# Patient Record
Sex: Male | Born: 1995 | Race: Black or African American | Hispanic: No | Marital: Married | Smoking: Current every day smoker
Health system: Southern US, Community
[De-identification: ages and names within clinical notes are randomized; demographics above are authoritative.]

## PROBLEM LIST (undated history)

## (undated) DIAGNOSIS — J45909 Unspecified asthma, uncomplicated: Secondary | ICD-10-CM

## (undated) DIAGNOSIS — E119 Type 2 diabetes mellitus without complications: Secondary | ICD-10-CM

---

## 2014-11-20 ENCOUNTER — Emergency Department (HOSPITAL_COMMUNITY): Payer: Medicaid Other

## 2014-11-20 ENCOUNTER — Encounter (HOSPITAL_COMMUNITY): Payer: Self-pay | Admitting: Emergency Medicine

## 2014-11-20 ENCOUNTER — Emergency Department (HOSPITAL_COMMUNITY)
Admission: EM | Admit: 2014-11-20 | Discharge: 2014-11-20 | Disposition: A | Discharge status: Home / Self Care | Payer: Medicaid Other | Attending: Emergency Medicine | Admitting: Emergency Medicine

## 2014-11-20 DIAGNOSIS — J209 Acute bronchitis, unspecified: Secondary | ICD-10-CM | POA: Diagnosis not present

## 2014-11-20 DIAGNOSIS — Z720 Tobacco use: Secondary | ICD-10-CM | POA: Diagnosis not present

## 2014-11-20 DIAGNOSIS — J40 Bronchitis, not specified as acute or chronic: Secondary | ICD-10-CM

## 2014-11-20 DIAGNOSIS — R05 Cough: Secondary | ICD-10-CM | POA: Diagnosis present

## 2014-11-20 MED ORDER — AZITHROMYCIN 250 MG PO TABS: 250.0000 mg | ORAL_TABLET | Freq: Every day | ORAL | Status: DC

## 2014-11-20 MED ORDER — ALBUTEROL SULFATE HFA 108 (90 BASE) MCG/ACT IN AERS: 1.0000 | INHALATION_SPRAY | Freq: Four times a day (QID) | RESPIRATORY_TRACT | Status: DC | PRN

## 2014-11-20 MED ORDER — BENZONATATE 100 MG PO CAPS: 100.0000 mg | ORAL_CAPSULE | Freq: Three times a day (TID) | ORAL | Status: DC

## 2014-11-20 NOTE — Discharge Instructions (Signed)

## 2014-11-20 NOTE — ED Provider Notes (Signed)
  History  By signing my name below, I, Karle PlumberJennifer Tensley, attest that this documentation has been prepared under the direction and in the presence of Langston MaskerKaren Tiandra Swoveland, [place]-C. Electronically Signed: Karle PlumberJennifer Tensley, ED Scribe. 11/20/2014. 5:28 PM.  Chief Complaint  Patient presents with  . Cough   The history is provided by the patient and medical records. No language interpreter was used.    HPI Comments:  Cody Tran is a 19 y.o. male who presents to the Emergency Department complaining of productive cough of yellow sputum that began about one week ago. He reports intermittent sore throat. He denies any known sick contacts. He has not taken anything to treat his symptoms. He denies modifying factors. He denies fever, chills, nausea or vomiting. He did not have a flu vaccination this year. He denies any chronic lung illnesses. He denies smoking but reports second hand exposure.  History reviewed. No pertinent past medical history. History reviewed. No pertinent past surgical history. No family history on file. Social History  Substance Use Topics  . Smoking status: Current Every Day Smoker  . Smokeless tobacco: None  . Alcohol Use: No    Review of Systems  Respiratory: Positive for cough.   All other systems reviewed and are negative.   Allergies  Review of patient's allergies indicates no known allergies.  Home Medications   Prior to Admission medications   Not on File   Triage Vitals: BP 131/62 mmHg  Pulse 98  Temp(Src) 97.9 F (36.6 C) (Oral)  Resp 20  Ht 5\' 9"  (1.753 m)  Wt 140 lb (63.504 kg)  BMI 20.67 kg/m2  SpO2 100% Physical Exam  Constitutional: He is oriented to person, place, and time. He appears well-developed and well-nourished.  HENT:  Head: Normocephalic and atraumatic.  Eyes: EOM are normal.  Neck: Normal range of motion.  Cardiovascular: Normal rate.   Pulmonary/Chest: Effort normal.  Musculoskeletal: Normal range of motion.  Neurological: He is  alert and oriented to person, place, and time.  Skin: Skin is warm and dry.  Psychiatric: He has a normal mood and affect. His behavior is normal.  Nursing note and vitals reviewed.   ED Course  Procedures (including critical care time) DIAGNOSTIC STUDIES: Oxygen Saturation is 100% on RA, normal by my interpretation.   COORDINATION OF CARE: 4:59 PM- Will order CXR. Pt verbalizes understanding and agrees to plan.  Medications - No data to display  Labs Review Labs Reviewed - No data to display  Imaging Review Dg Chest 2 View  11/20/2014  CLINICAL DATA:  Productive cough.  Yellow sputum.  Smoker. EXAM: CHEST - 2 VIEW COMPARISON:  None. FINDINGS: The heart size and mediastinal contours are within normal limits. Both lungs are clear. The visualized skeletal structures are unremarkable. IMPRESSION: Negative two view chest x-ray Electronically Signed   By: Marin Robertshristopher  Mattern M.D.   On: 11/20/2014 17:24   I have personally reviewed and evaluated these images and lab results as part of my medical decision-making.   EKG Interpretation None      MDM   Final diagnoses:  Bronchitis     zithromax Tessalon Return if any problems.  Lonia SkinnerLeslie K La PineSofia, PA-C 11/20/14 1746  Donnetta HutchingBrian Cook, MD 11/20/14 [id number]

## 2014-11-20 NOTE — ED Notes (Signed)
Pt c/o productive cough with yellow sputum x 1 week.

## 2016-02-03 ENCOUNTER — Emergency Department (HOSPITAL_COMMUNITY)
Admission: EM | Admit: 2016-02-03 | Discharge: 2016-02-03 | Disposition: A | Discharge status: Home / Self Care | Payer: Medicaid Other | Attending: Emergency Medicine | Admitting: Emergency Medicine

## 2016-02-03 ENCOUNTER — Encounter (HOSPITAL_COMMUNITY): Payer: Self-pay | Admitting: Emergency Medicine

## 2016-02-03 DIAGNOSIS — F17200 Nicotine dependence, unspecified, uncomplicated: Secondary | ICD-10-CM | POA: Insufficient documentation

## 2016-02-03 DIAGNOSIS — B349 Viral infection, unspecified: Secondary | ICD-10-CM | POA: Insufficient documentation

## 2016-02-03 MED ORDER — IBUPROFEN 400 MG PO TABS
400.0000 mg | ORAL_TABLET | Freq: Once | ORAL | Status: AC
  Administered 2016-02-03: 400 mg via ORAL
  Filled 2016-02-03: qty 1

## 2016-02-03 MED ORDER — ACETAMINOPHEN 325 MG PO TABS
650.0000 mg | ORAL_TABLET | Freq: Once | ORAL | Status: AC
  Administered 2016-02-03: 650 mg via ORAL
  Filled 2016-02-03: qty 2

## 2016-02-03 MED ORDER — OSELTAMIVIR PHOSPHATE 75 MG PO CAPS: 75.0000 mg | ORAL_CAPSULE | Freq: Two times a day (BID) | ORAL | 0 refills | Status: DC

## 2016-02-03 NOTE — ED Provider Notes (Signed)
[facility]-EMERGENCY DEPT Provider Note   CSN: [id number] Arrival date & time: 02/03/16  1538     History   Chief Complaint Chief Complaint  Patient presents with  . Fever  . Generalized Body Aches    HPI Cody Tran is a 21 y.o. male.  Feeling hot and cold, body aches, cough for 24 hours. Patient is taken Benadryl and Alka-Seltzer with minimal relief. He is normally healthy. He has been drinking fluids. Severity of symptoms is mild to moderate. No stiff neck.      History reviewed. No pertinent past medical history.  There are no active problems to display for this patient.   History reviewed. No pertinent surgical history.     Home Medications    Prior to Admission medications   Medication Sig Start Date End Date Taking? Authorizing Provider  ciprofloxacin (CIPRO) 500 MG tablet Take 500 mg by mouth 2 (two) times daily. 7 day course completed. Started on 01/19/2016 01/19/16   Historical Provider, MD  oseltamivir (TAMIFLU) 75 MG capsule Take 1 capsule (75 mg total) by mouth every 12 (twelve) hours. 02/03/16   Donnetta Hutching, MD    Family History No family history on file.  Social History Social History  Substance Use Topics  . Smoking status: Current Every Day Smoker  . Smokeless tobacco: Never Used  . Alcohol use No     Allergies   Patient has no known allergies.   Review of Systems Review of Systems  All other systems reviewed and are negative.    Physical Exam Updated Vital Signs BP 126/67 (BP Location: Left Arm)   Pulse 105   Temp 102.4 F (39.1 C) (Oral)   Resp 18   Ht 6\' 1"  (1.854 m)   Wt 232 lb (105.2 kg)   SpO2 100%   BMI 30.61 kg/m   Physical Exam  Constitutional: He is oriented to person, place, and time. He appears well-developed and well-nourished.  No acute distress  HENT:  Head: Normocephalic and atraumatic.  Clear rhinorrhea  Eyes: Conjunctivae are normal.  Neck: Neck supple.  Cardiovascular: Normal rate and regular rhythm.     Pulmonary/Chest: Effort normal and breath sounds normal.  Abdominal: Soft. Bowel sounds are normal.  Musculoskeletal: Normal range of motion.  Neurological: He is alert and oriented to person, place, and time.  Skin: Skin is warm and dry.  Psychiatric: He has a normal mood and affect. His behavior is normal.  Nursing note and vitals reviewed.    ED Treatments / Results  Labs (all labs ordered are listed, but only abnormal results are displayed) Labs Reviewed - No data to display  EKG  EKG Interpretation None       Radiology No results found.  Procedures Procedures (including critical care time)  Medications Ordered in ED Medications  acetaminophen (TYLENOL) tablet 650 mg (650 mg Oral Given 02/03/16 1610)  ibuprofen (ADVIL,MOTRIN) tablet 400 mg (400 mg Oral Given 02/03/16 1717)     Initial Impression / Assessment and Plan / ED Course  I have reviewed the triage vital signs and the nursing notes.  Pertinent labs & imaging results that were available during my care of the patient were reviewed by me and considered in my medical decision making (see chart for details).     History and physical most compatible with viral syndrome. Will Rx Tamiflu at patient's request. Otherwise, I recommend fluids, Tylenol/ibuprofen, over-the-counter cold and flu meds  Final Clinical Impressions(s) / ED Diagnoses   Final diagnoses:  Viral syndrome    New Prescriptions New Prescriptions   OSELTAMIVIR (TAMIFLU) 75 MG CAPSULE    Take 1 capsule (75 mg total) by mouth every 12 (twelve) hours.     Donnetta HutchingBrian Dary Dilauro, MD 02/03/16 1740

## 2016-02-03 NOTE — Discharge Instructions (Signed)
Increase fluids, Tylenol or ibuprofen, recommend over-the-counter cold and flu medication. Rx for Tamiflu

## 2016-02-03 NOTE — ED Triage Notes (Signed)
Pt reports fever and body aches, productive cough since yesterday. States he took benadryl and alka seltzer plus this morning around 9 am.

## 2016-10-07 IMAGING — DX DG CHEST 2V
2 series · 2 of 2 positions shown · non-contrast
Comparison: None.

CLINICAL DATA: Productive cough.  Yellow sputum.  Smoker.

EXAM:
CHEST - 2 VIEW

[chest pa]
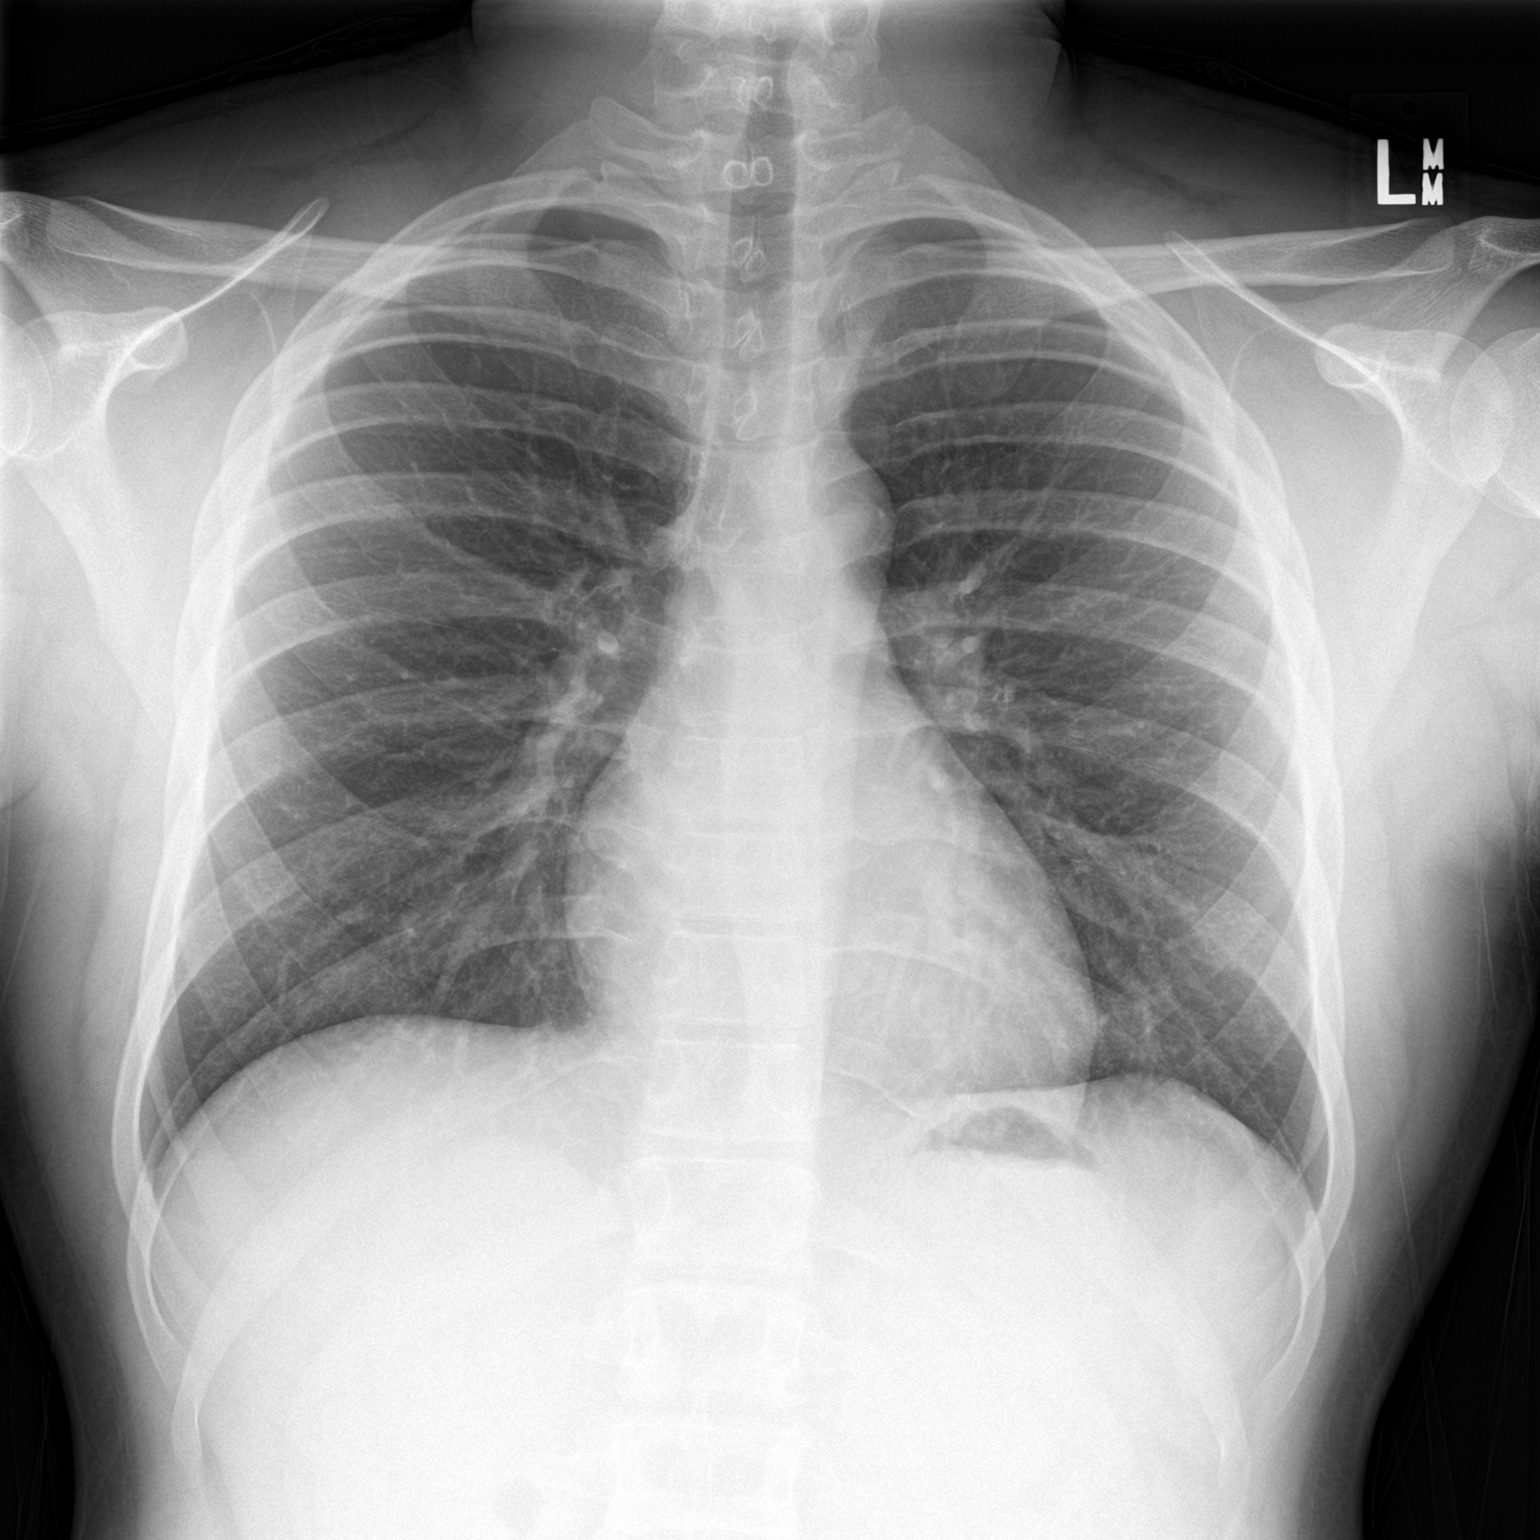

[chest lat]
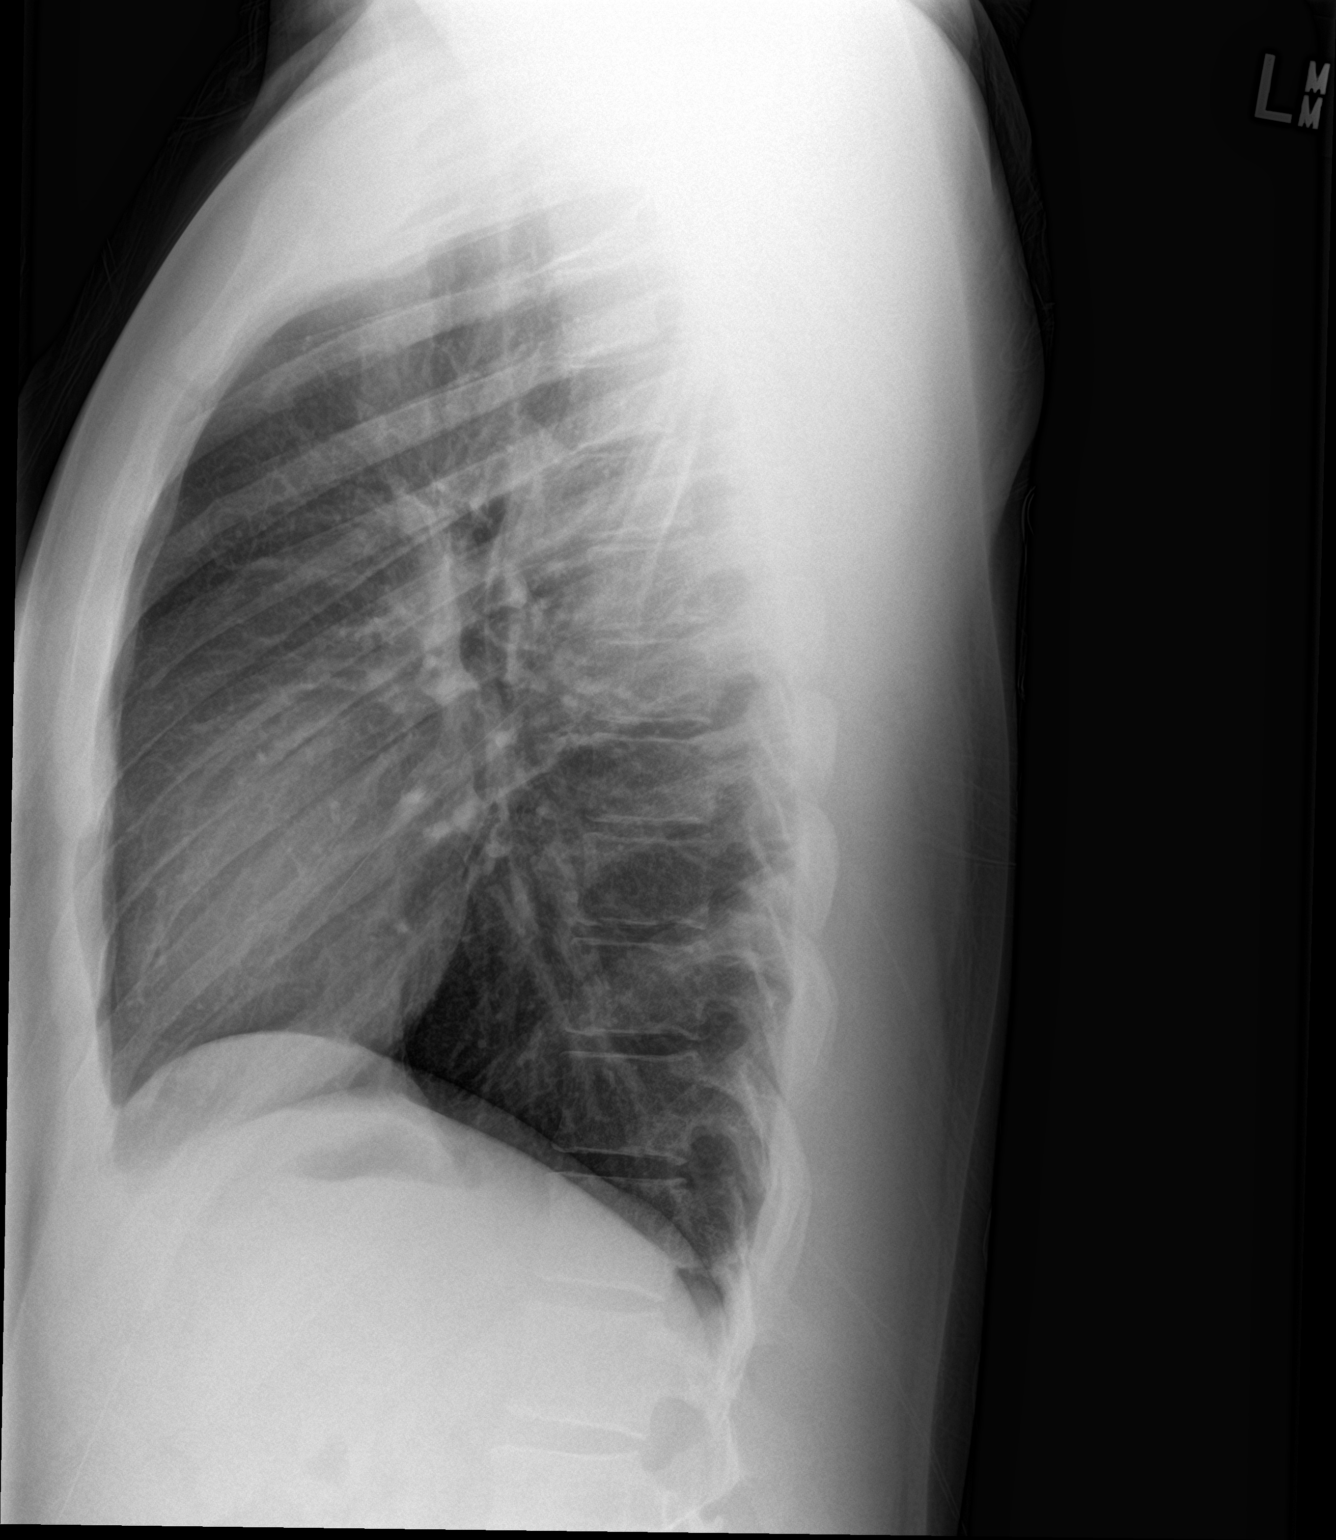

[2 of 2 positions shown; findings below may reference images not displayed]

FINDINGS: The heart size and mediastinal contours are within normal limits.
Both lungs are clear. The visualized skeletal structures are
unremarkable.
IMPRESSION: Negative two view chest x-ray

## 2018-03-04 ENCOUNTER — Encounter (HOSPITAL_COMMUNITY): Payer: Self-pay | Admitting: Emergency Medicine

## 2018-03-04 ENCOUNTER — Emergency Department (HOSPITAL_COMMUNITY)
Admission: EM | Admit: 2018-03-04 | Discharge: 2018-03-04 | Disposition: A | Discharge status: Home / Self Care | Payer: BLUE CROSS/BLUE SHIELD | Attending: Emergency Medicine | Admitting: Emergency Medicine

## 2018-03-04 ENCOUNTER — Other Ambulatory Visit: Payer: Self-pay

## 2018-03-04 DIAGNOSIS — J45909 Unspecified asthma, uncomplicated: Secondary | ICD-10-CM | POA: Diagnosis not present

## 2018-03-04 DIAGNOSIS — F17200 Nicotine dependence, unspecified, uncomplicated: Secondary | ICD-10-CM | POA: Insufficient documentation

## 2018-03-04 DIAGNOSIS — R05 Cough: Secondary | ICD-10-CM | POA: Diagnosis present

## 2018-03-04 DIAGNOSIS — J209 Acute bronchitis, unspecified: Secondary | ICD-10-CM | POA: Insufficient documentation

## 2018-03-04 DIAGNOSIS — J40 Bronchitis, not specified as acute or chronic: Secondary | ICD-10-CM

## 2018-03-04 HISTORY — DX: Unspecified asthma, uncomplicated: J45.909

## 2018-03-04 MED ORDER — PREDNISONE 20 MG PO TABS
40.0000 mg | ORAL_TABLET | Freq: Once | ORAL | Status: AC
Start: 2018-03-04 — End: 2018-03-04
  Administered 2018-03-04: 40 mg via ORAL
  Filled 2018-03-04: qty 2

## 2018-03-04 MED ORDER — DEXAMETHASONE 4 MG PO TABS: 4.0000 mg | ORAL_TABLET | Freq: Two times a day (BID) | ORAL | 0 refills | Status: DC

## 2018-03-04 MED ORDER — ALBUTEROL SULFATE HFA 108 (90 BASE) MCG/ACT IN AERS
2.0000 | INHALATION_SPRAY | Freq: Once | RESPIRATORY_TRACT | Status: AC
  Administered 2018-03-04: 2 via RESPIRATORY_TRACT
  Filled 2018-03-04: qty 6.7

## 2018-03-04 NOTE — Progress Notes (Signed)
**Note De-Identified Uriyah Raska Obfuscation** Patient flow measured with dial a check system and education on MDI with spacer

## 2018-03-04 NOTE — Discharge Instructions (Signed)
Your blood pressure is elevated today.  Please have this rechecked soon.  Your oxygen level is 99% on room air.  Within normal limits by my interpretation.  Please increase fluids.  Please use 2 puffs of albuterol every 4 hours as needed for wheezing or difficulty with breathing.  Use Decadron 2 times daily with food.  Please avoid being around flour or similar substances.  Please see the physicians at the [facility] clinic to establish a primary care physician.

## 2018-03-04 NOTE — ED Triage Notes (Signed)
Cough and wheezing since working around flour at work today.  resp even and non labored.  Pt reports he needs a dr note to return to work.

## 2018-03-04 NOTE — ED Provider Notes (Signed)
[facility] EMERGENCY DEPARTMENT Provider Note   CSN: [id number] Arrival date & time: 03/04/18  1514    History   Chief Complaint Chief Complaint  Patient presents with  . Cough    HPI Cody Tran is a 23 y.o. male.     Patient is a 23 year old male who presents to the emergency department with a complaint of coughing and wheezing.  The patient states that today he was placed in an area of his work where he had to be around a lot of flour and dust.  The patient states that he has asthma, and intermittent episodes of bronchitis.  When he was placed in the area with the flower he says he began to cough and wheeze.  He was sent to the emergency department for evaluation.  He was told that he would need a note to return to work.  There was no hemoptysis reported.  No high fever noted.  No loss of consciousness.  The history is provided by the patient.  Cough  Associated symptoms: no chest pain, no eye discharge, no shortness of breath and no wheezing     Past Medical History:  Diagnosis Date  . Asthma     There are no active problems to display for this patient.   History reviewed. No pertinent surgical history.      Home Medications    Prior to Admission medications   Medication Sig Start Date End Date Taking? Authorizing Provider  ciprofloxacin (CIPRO) 500 MG tablet Take 500 mg by mouth 2 (two) times daily. 7 day course completed. Started on 01/19/2016 01/19/16   [provider]  oseltamivir (TAMIFLU) 75 MG capsule Take 1 capsule (75 mg total) by mouth every 12 (twelve) hours. 02/03/16   Donnetta Hutching, MD    Family History No family history on file.  Social History Social History   Tobacco Use  . Smoking status: Current Every Day Smoker  . Smokeless tobacco: Never Used  Substance Use Topics  . Alcohol use: No  . Drug use: No     Allergies   Patient has no known allergies.   Review of Systems Review of Systems  Constitutional: Negative for  activity change.       All ROS Neg except as noted in HPI  HENT: Negative for nosebleeds.   Eyes: Negative for photophobia and discharge.  Respiratory: Positive for cough. Negative for shortness of breath and wheezing.   Cardiovascular: Negative for chest pain and palpitations.  Gastrointestinal: Negative for abdominal pain and blood in stool.  Genitourinary: Negative for dysuria, frequency and hematuria.  Musculoskeletal: Negative for arthralgias, back pain and neck pain.  Skin: Negative.   Neurological: Negative for dizziness, seizures and speech difficulty.  Psychiatric/Behavioral: Negative for confusion and hallucinations.     Physical Exam Updated Vital Signs BP (!) 142/68 (BP Location: Right Arm)   Pulse 70   Temp 98.1 F (36.7 C) (Oral)   Resp 20   Ht 5\' 8"  (1.727 m)   Wt 99.8 kg   SpO2 99%   BMI 33.45 kg/m   Physical Exam Vitals signs and nursing note reviewed.  Constitutional:      Appearance: He is well-developed. He is not toxic-appearing.  HENT:     Head: Normocephalic.     Right Ear: Tympanic membrane and external ear normal.     Left Ear: Tympanic membrane and external ear normal.  Eyes:     General: Lids are normal.     Pupils: Pupils  are equal, round, and reactive to light.  Neck:     Musculoskeletal: Normal range of motion and neck supple.     Vascular: No carotid bruit.  Cardiovascular:     Rate and Rhythm: Normal rate and regular rhythm.     Pulses: Normal pulses.     Heart sounds: Normal heart sounds.  Pulmonary:     Effort: No respiratory distress.     Breath sounds: No stridor. Rhonchi present.     Comments: Few soft wheezes bilaterally. There is symmetrical rise and fall of the chest.  The patient speaks in complete sentences without problem. Abdominal:     General: Bowel sounds are normal.     Palpations: Abdomen is soft.     Tenderness: There is no abdominal tenderness. There is no guarding.  Musculoskeletal: Normal range of motion.      Comments: Capillary refill is less than 2 seconds bilaterally.  Lymphadenopathy:     Head:     Right side of head: No submandibular adenopathy.     Left side of head: No submandibular adenopathy.     Cervical: No cervical adenopathy.  Skin:    General: Skin is warm and dry.  Neurological:     Mental Status: He is alert and oriented to person, place, and time.     Cranial Nerves: No cranial nerve deficit.     Sensory: No sensory deficit.  Psychiatric:        Speech: Speech normal.      ED Treatments / Results  Labs (all labs ordered are listed, but only abnormal results are displayed) Labs Reviewed - No data to display  EKG None  Radiology No results found.  Procedures Procedures (including critical care time)  Medications Ordered in ED Medications - No data to display   Initial Impression / Assessment and Plan / ED Course  I have reviewed the triage vital signs and the nursing notes.  Pertinent labs & imaging results that were available during my care of the patient were reviewed by me and considered in my medical decision making (see chart for details).          Final Clinical Impressions(s) / ED Diagnoses MDM  Blood pressure is elevated.  I have asked the patient to have this rechecked soon.  Vital signs otherwise within normal limits.  Pulse oximetry is 99% on room air.  Within normal limits by my interpretation.  The patient has some soft soft wheezes present and some scattered rhonchi.  Patient is treated with steroids and multidose inhalers.  The patient is given a note excusing him from being exposed to flour and similar substances over the next 2 weeks.  Have given the patient resources to see the [facility] clinic, as well as the [place] clinic to establish a primary physician.     Final diagnoses:  Bronchitis    ED Discharge Orders         Ordered    dexamethasone (DECADRON) 4 MG tablet  2 times daily with meals     03/04/18 1630             Ivery Quale, PA-C 03/04/18 1646    Raeford Razor, MD 03/04/18 2053

## 2018-09-29 ENCOUNTER — Other Ambulatory Visit: Payer: Self-pay

## 2018-09-29 DIAGNOSIS — Z20822 Contact with and (suspected) exposure to covid-19: Secondary | ICD-10-CM

## 2018-10-01 ENCOUNTER — Telehealth: Payer: Self-pay | Admitting: General Practice

## 2018-10-01 LAB — NOVEL CORONAVIRUS, NAA: SARS-CoV-2, NAA: NOT DETECTED

## 2018-10-01 NOTE — Telephone Encounter (Signed)
Negative COVID results given. Patient results "NOT Detected." Caller expressed understanding. ° °

## 2018-11-03 ENCOUNTER — Other Ambulatory Visit: Payer: Self-pay

## 2018-11-03 DIAGNOSIS — Z20822 Contact with and (suspected) exposure to covid-19: Secondary | ICD-10-CM

## 2018-11-04 LAB — NOVEL CORONAVIRUS, NAA: SARS-CoV-2, NAA: DETECTED — AB

## 2020-08-16 ENCOUNTER — Encounter: Payer: Self-pay | Admitting: Emergency Medicine

## 2020-08-16 ENCOUNTER — Ambulatory Visit
Admission: EM | Admit: 2020-08-16 | Discharge: 2020-08-16 | Disposition: A | Discharge status: Home / Self Care | Payer: BC Managed Care – PPO | Attending: Family Medicine | Admitting: Family Medicine

## 2020-08-16 ENCOUNTER — Other Ambulatory Visit: Payer: Self-pay

## 2020-08-16 DIAGNOSIS — Z202 Contact with and (suspected) exposure to infections with a predominantly sexual mode of transmission: Secondary | ICD-10-CM | POA: Insufficient documentation

## 2020-08-16 HISTORY — DX: Type 2 diabetes mellitus without complications: E11.9

## 2020-08-16 NOTE — Discharge Instructions (Signed)
We have sent testing for sexually transmitted infections. We will notify you of any positive results once they are received. If required, we will prescribe any medications you might need.  Please refrain from all sexual activity for at least the next seven days.  

## 2020-08-16 NOTE — ED Triage Notes (Signed)
States he wants and STD check due to wife telling him she has an STD

## 2020-08-16 NOTE — ED Provider Notes (Signed)
  [facility] CARE CENTER   [id number] 08/16/20 Arrival Time: 0818  ASSESSMENT & PLAN:  1. Possible exposure to STD       Discharge Instructions      We have sent testing for sexually transmitted infections. We will notify you of any positive results once they are received. If required, we will prescribe any medications you might need.  Please refrain from all sexual activity for at least the next seven days.     Pending: Labs Reviewed  CYTOLOGY, (ORAL, ANAL, URETHRAL) ANCILLARY ONLY    Will notify of any positive results. Instructed to refrain from sexual activity for at least seven days.  Reviewed expectations re: course of current medical issues. Questions answered. Outlined signs and symptoms indicating need for more acute intervention. Patient verbalized understanding. After Visit Summary given.   SUBJECTIVE:  Cody Tran is a 25 y.o. male who reports possible STI exposure; chlamydia. Ex-wife had tested positive. He reports no symptoms. Afebrile. No abdominal or pelvic pain. No n/v. No rashes or lesions.   OBJECTIVE:  Vitals:   08/16/20 0827  BP: 136/83  Pulse: 74  Resp: 17  Temp: (!) 97.5 F (36.4 C)  TempSrc: Tympanic  SpO2: 98%     General appearance: alert, cooperative, appears stated age and no distress Lungs: unlabored respirations; speaks full sentences without difficulty GU: deferred Skin: warm and dry Psychological: alert and cooperative; normal mood and affect.    Labs Reviewed  CYTOLOGY, (ORAL, ANAL, URETHRAL) ANCILLARY ONLY    No Known Allergies  Past Medical History:  Diagnosis Date   Asthma    Diabetes mellitus without complication (HCC)    Family History  Problem Relation Age of Onset   Healthy Mother    Diabetes Father    Social History   Socioeconomic History   Marital status: Married    Spouse name: Not on file   Number of children: Not on file   Years of education: Not on file   Highest education level: Not on  file  Occupational History   Not on file  Tobacco Use   Smoking status: Every Day   Smokeless tobacco: Never  Substance and Sexual Activity   Alcohol use: No   Drug use: No   Sexual activity: Not on file  Cody Topics Concern   Not on file  Social History Narrative   Not on file   Social Determinants of Health   Financial Resource Strain: Not on file  Food Insecurity: Not on file  Transportation Needs: Not on file  Physical Activity: Not on file  Stress: Not on file  Social Connections: Not on file  Intimate Partner Violence: Not on file           Mardella Layman, MD 08/16/20 [phone]

## 2020-08-17 ENCOUNTER — Telehealth (HOSPITAL_COMMUNITY): Payer: Self-pay | Admitting: Emergency Medicine

## 2020-08-17 LAB — CYTOLOGY, (ORAL, ANAL, URETHRAL) ANCILLARY ONLY
Chlamydia: POSITIVE — AB
Comment: NEGATIVE
Comment: NEGATIVE
Comment: NORMAL
Neisseria Gonorrhea: NEGATIVE
Trichomonas: NEGATIVE

## 2020-08-17 MED ORDER — DOXYCYCLINE HYCLATE 100 MG PO CAPS: 100.0000 mg | ORAL_CAPSULE | Freq: Two times a day (BID) | ORAL | 0 refills | Status: AC

## 2020-09-14 ENCOUNTER — Encounter: Payer: Self-pay | Admitting: Emergency Medicine

## 2020-09-14 ENCOUNTER — Ambulatory Visit
Admission: EM | Admit: 2020-09-14 | Discharge: 2020-09-14 | Disposition: A | Discharge status: Home / Self Care | Payer: Self-pay | Attending: Emergency Medicine | Admitting: Emergency Medicine

## 2020-09-14 ENCOUNTER — Other Ambulatory Visit: Payer: Self-pay

## 2020-09-14 DIAGNOSIS — R5383 Other fatigue: Secondary | ICD-10-CM

## 2020-09-14 DIAGNOSIS — E11649 Type 2 diabetes mellitus with hypoglycemia without coma: Secondary | ICD-10-CM

## 2020-09-14 LAB — POCT FASTING CBG KUC MANUAL ENTRY: POCT Glucose (KUC): 149 mg/dL — AB (ref 70–99)

## 2020-09-14 NOTE — ED Provider Notes (Signed)
[facility] CARE CENTER   [id number] 09/14/20 Arrival Time: 1143  CC: DM  SUBJECTIVE:  Vian Fluegel is a 25 y.o. male who present for possible hypoglycemic episode that occurred last night at 11-12 last night/ this morning.  Reports weakness, and hand numbness.  Hx significant for DM.  Currently diet controlled.  Blood glucose on average in the 100's.  Does not checking glucose while fasting.  His symptoms improved with drinking ginger ale and eating crackers.  Reports fatigue today.  Requests work note.  Denies fever, chills, nausea, vomiting, abdominal pain, CP, SOB.  Does admit to polydipsia and polyuria.     ROS: As per HPI.  All other pertinent ROS negative.     Past Medical History:  Diagnosis Date   Asthma    Diabetes mellitus without complication (HCC)    History reviewed. No pertinent surgical history. No Known Allergies No current facility-administered medications on file prior to encounter.   No current outpatient medications on file prior to encounter.   Social History   Socioeconomic History   Marital status: Married    Spouse name: Not on file   Number of children: Not on file   Years of education: Not on file   Highest education level: Not on file  Occupational History   Not on file  Tobacco Use   Smoking status: Every Day   Smokeless tobacco: Never  Substance and Sexual Activity   Alcohol use: No   Drug use: No   Sexual activity: Not on file  Other Topics Concern   Not on file  Social History Narrative   Not on file   Social Determinants of Health   Financial Resource Strain: Not on file  Food Insecurity: Not on file  Transportation Needs: Not on file  Physical Activity: Not on file  Stress: Not on file  Social Connections: Not on file  Intimate Partner Violence: Not on file   Family History  Problem Relation Age of Onset   Healthy Mother    Diabetes Father     OBJECTIVE:  Vitals:   09/14/20 1230  BP: (!) 163/90  Pulse: 73  Resp:  19  Temp: 98.9 F (37.2 C)  TempSrc: Oral  SpO2: 97%    General appearance: alert; no distress Eyes: PERRLA; EOMI HENT: normocephalic; atraumatic Neck: supple with FROM Lungs: clear to auscultation bilaterally Heart: regular rate and rhythm.  Extremities: no edema; symmetrical with no gross deformities Skin: warm and dry Neurologic: CN 2-12 grossly intact; normal gait Psychological: alert and cooperative; normal mood and affect  Results for orders placed or performed during the hospital encounter of 09/14/20 (from the past 24 hour(s))  POCT CBG (manual entry)     Status: Abnormal   Collection Time: 09/14/20 12:35 PM  Result Value Ref Range   POCT Glucose ([facility]) 149 (A) 70 - 99 mg/dL     ASSESSMENT & PLAN:  1. Other fatigue   2. Type 2 diabetes mellitus with hypoglycemia without coma, without long-term current use of insulin (HCC)    Continue to monitor blood glucose.  Keep a log to present to your PCP.  You can also set an alarm for 11pm-12am to check sugar to see how low it is dropping Have glucose tablets or another snack on night stand in case you experience another low sugar episode Have family check in with you in the mornings Continue to practice healthy lifestyle and eating fruits, vegetables, lean meats Follow up with PCP for recheck and further  evaluation and management Return or go to the ER if you have any new or worsening symptoms such as fever, chills, nausea, vomiting, chest pain, shortness of breath, cough, vision changes, slurred speech, fainting, abdominal pain, changes in bowel or bladder habits, etc...  Reviewed expectations re: course of current medical issues. Questions answered. Outlined signs and symptoms indicating need for more acute intervention. Patient verbalized understanding. After Visit Summary given.    Rennis Harding, PA-C 09/14/20 1301

## 2020-09-14 NOTE — Discharge Instructions (Addendum)
Continue to monitor blood glucose.  Keep a log to present to your PCP.  You can also set an alarm for 11pm-12am to check sugar to see how low it is dropping Have glucose tablets or another snack on night stand in case you experience another low sugar episode Have family check in with you in the mornings Continue to practice healthy lifestyle and eating fruits, vegetables, lean meats Follow up with PCP for recheck and further evaluation and management Return or go to the ER if you have any new or worsening symptoms such as fever, chills, nausea, vomiting, chest pain, shortness of breath, cough, vision changes, slurred speech, fainting, abdominal pain, changes in bowel or bladder habits, etc..Marland Kitchen

## 2020-09-14 NOTE — ED Triage Notes (Signed)
Pt called for ready room

## 2020-09-14 NOTE — ED Triage Notes (Signed)
Pt states he has been dx with DM type 2 but is diet controlled at this point.  Woke up in the middle of the night last night and felt weak, both hands felt numb. Pt drank some ginger ale and ate crackers and made him feel better.  Pt states she has felt fatigued today.

## 2022-04-02 ENCOUNTER — Emergency Department (HOSPITAL_BASED_OUTPATIENT_CLINIC_OR_DEPARTMENT_OTHER): Payer: Self-pay

## 2022-04-02 ENCOUNTER — Encounter (HOSPITAL_BASED_OUTPATIENT_CLINIC_OR_DEPARTMENT_OTHER): Payer: Self-pay | Admitting: Emergency Medicine

## 2022-04-02 ENCOUNTER — Emergency Department (HOSPITAL_BASED_OUTPATIENT_CLINIC_OR_DEPARTMENT_OTHER)
Admission: EM | Admit: 2022-04-02 | Discharge: 2022-04-02 | Disposition: A | Discharge status: Home / Self Care | Payer: Self-pay | Attending: Emergency Medicine | Admitting: Emergency Medicine

## 2022-04-02 ENCOUNTER — Other Ambulatory Visit: Payer: Self-pay

## 2022-04-02 DIAGNOSIS — J45909 Unspecified asthma, uncomplicated: Secondary | ICD-10-CM | POA: Insufficient documentation

## 2022-04-02 DIAGNOSIS — R059 Cough, unspecified: Secondary | ICD-10-CM | POA: Insufficient documentation

## 2022-04-02 DIAGNOSIS — R058 Other specified cough: Secondary | ICD-10-CM

## 2022-04-02 DIAGNOSIS — Z20822 Contact with and (suspected) exposure to covid-19: Secondary | ICD-10-CM | POA: Insufficient documentation

## 2022-04-02 LAB — RESP PANEL BY RT-PCR (RSV, FLU A&B, COVID)  RVPGX2
Influenza A by PCR: NEGATIVE
Influenza B by PCR: NEGATIVE
Resp Syncytial Virus by PCR: NEGATIVE
SARS Coronavirus 2 by RT PCR: NEGATIVE

## 2022-04-02 MED ORDER — ALBUTEROL SULFATE HFA 108 (90 BASE) MCG/ACT IN AERS: 1.0000 | INHALATION_SPRAY | Freq: Four times a day (QID) | RESPIRATORY_TRACT | 0 refills | Status: AC | PRN

## 2022-04-02 NOTE — ED Triage Notes (Signed)
Cough x 1 month , son is sick . Reports some shortness of breath , no chest pain .

## 2022-04-02 NOTE — ED Provider Notes (Signed)
[facility] Provider Note   CSN: [id number] Arrival date & time: 04/02/22  [id number]     History  Chief Complaint  Patient presents with   Cough    Cody Tran is a 27 y.o. male with past medical history significant for obesity, asthma who presents with concern for mild cough in the mornings with occasional production of yellowish phlegm.  Patient reports that he has occasional shortness of breath associated with cough.  Patient with a history of asthma, reports that he does not have any inhalers right now, has not had to use an inhaler recently, and does not have 1 filled at this moment.  Patient denies any fever, chest pain, abdominal pain, nausea, vomiting.  He reports his son at home has been sick with probable viral illness.   Cough      Home Medications Prior to Admission medications   Not on File      Allergies    Patient has no known allergies.    Review of Systems   Review of Systems  Respiratory:  Positive for cough.   All other systems reviewed and are negative.   Physical Exam Updated Vital Signs BP (!) 138/95   Pulse 90   Temp 98.2 F (36.8 C)   Resp 20   Wt (!) 145.2 kg   SpO2 98%   BMI 48.66 kg/m  Physical Exam Vitals and nursing note reviewed.  Constitutional:      General: He is not in acute distress.    Appearance: Normal appearance.  HENT:     Head: Normocephalic and atraumatic.  Eyes:     General:        Right eye: No discharge.        Left eye: No discharge.  Cardiovascular:     Rate and Rhythm: Normal rate and regular rhythm.  Pulmonary:     Effort: Pulmonary effort is normal. No respiratory distress.     Comments: Mild scattered rhonchi that clear with cough.  No respiratory distress, no wheezing, stridor, rales.  No focal consolidation. Musculoskeletal:        General: No deformity.  Skin:    General: Skin is warm and dry.  Neurological:     Mental Status: He is alert and oriented  to person, place, and time.  Psychiatric:        Mood and Affect: Mood normal.        Behavior: Behavior normal.     ED Results / Procedures / Treatments   Labs (all labs ordered are listed, but only abnormal results are displayed) Labs Reviewed  RESP PANEL BY RT-PCR (RSV, FLU A&B, COVID)  [id number]    EKG None  Radiology DG Chest 2 View  Result Date: 04/02/2022 CLINICAL DATA:  Cough for 1 month.  Shortness of breath EXAM: CHEST - 2 VIEW COMPARISON:  X-ray 11/20/2014 FINDINGS: No consolidation, pneumothorax or effusion. No edema. Normal cardiopericardial silhouette. IMPRESSION: No acute cardiopulmonary disease Electronically Signed   By: Jill Side M.D.   On: 04/02/2022 10:21    Procedures Procedures    Medications Ordered in ED Medications - No data to display  ED Course/ Medical Decision Making/ A&P                             Medical Decision Making Amount and/or Complexity of Data Reviewed Radiology: ordered.   This is a well-appearing 27yo male who  presents with concern for 30 days of cough, occasional shortness of breath.  My emergent differential diagnosis includes acute upper respiratory infection with COVID, flu, RSV versus new asthma presentation, acute bronchitis, less clinical concern for pneumonia.  Also considered other ENT emergencies, Ludwig angina, strep pharyngitis, mono, versus epiglottis, tonsillitis versus other.  This is not an exhaustive differential.  On my exam patient is overall well-appearing, they have temperature of 98.2, breathing unlabored, no tachypnea, no respiratory distress, stable oxygen saturation.  Patient without tachycardia. RVP independently reviewed by myself shows for COVID, flu, RSV. I independently interpreted imaging including plain, chest x-ray which shows no acute intrathoracic abnormality. I agree with the radiologist interpretation. Patient symptoms are consistent with postviral cough syndrome. encouraged ibuprofen, Tylenol, rest,  plenty of fluids.  I will refill patient's home inhaler.  Encouraged PCP follow-up, establishing PCP if He does not have one. discussed extensive return precautions.  Patient discharged in stable condition at this time.  Final Clinical Impression(s) / ED Diagnoses Final diagnoses:  Post-viral cough syndrome    Rx / DC Orders ED Discharge Orders     None         Anselmo Pickler, PA-C 04/02/22 1049    Tretha Sciara, MD 04/03/22 [phone]

## 2022-04-02 NOTE — ED Notes (Signed)
Pt discharged to home. Discharge instructions have been discussed with patient and/or family members. Pt verbally acknowledges understanding d/c instructions, and endorses comprehension to checkout at registration before leaving.  °
